# Patient Record
Sex: Female | Born: 1960 | Race: White | Hispanic: No | Marital: Married | Smoking: Never smoker
Health system: Southern US, Community
[De-identification: ages and names within clinical notes are randomized; demographics above are authoritative.]

## PROBLEM LIST (undated history)

## (undated) DIAGNOSIS — K589 Irritable bowel syndrome without diarrhea: Secondary | ICD-10-CM

---

## 2019-04-06 ENCOUNTER — Encounter (HOSPITAL_BASED_OUTPATIENT_CLINIC_OR_DEPARTMENT_OTHER): Payer: Self-pay | Admitting: *Deleted

## 2019-04-06 ENCOUNTER — Emergency Department (HOSPITAL_BASED_OUTPATIENT_CLINIC_OR_DEPARTMENT_OTHER): Payer: BC Managed Care – PPO

## 2019-04-06 ENCOUNTER — Other Ambulatory Visit: Payer: Self-pay

## 2019-04-06 ENCOUNTER — Emergency Department (HOSPITAL_BASED_OUTPATIENT_CLINIC_OR_DEPARTMENT_OTHER)
Admission: EM | Admit: 2019-04-06 | Discharge: 2019-04-06 | Disposition: A | Discharge status: Home / Self Care | Payer: BC Managed Care – PPO | Attending: Emergency Medicine | Admitting: Emergency Medicine

## 2019-04-06 DIAGNOSIS — R519 Headache, unspecified: Secondary | ICD-10-CM | POA: Insufficient documentation

## 2019-04-06 DIAGNOSIS — K5900 Constipation, unspecified: Secondary | ICD-10-CM | POA: Diagnosis not present

## 2019-04-06 HISTORY — DX: Irritable bowel syndrome, unspecified: K58.9

## 2019-04-06 MED ORDER — DIPHENHYDRAMINE HCL 50 MG/ML IJ SOLN
25.0000 mg | Freq: Once | INTRAMUSCULAR | Status: DC
  Filled 2019-04-06: qty 1

## 2019-04-06 MED ORDER — KETOROLAC TROMETHAMINE 15 MG/ML IJ SOLN
15.0000 mg | Freq: Once | INTRAMUSCULAR | Status: AC
  Administered 2019-04-06: 17:00:00 15 mg via INTRAMUSCULAR
  Filled 2019-04-06: qty 1

## 2019-04-06 MED ORDER — PROCHLORPERAZINE EDISYLATE 10 MG/2ML IJ SOLN
10.0000 mg | Freq: Once | INTRAMUSCULAR | Status: DC
  Filled 2019-04-06: qty 2

## 2019-04-06 NOTE — Discharge Instructions (Addendum)
The CT of your head was normal today.  You may alternate ibuprofen or Tylenol to help with your headaches.  Follow-up with your primary care physician as needed.

## 2019-04-06 NOTE — ED Provider Notes (Signed)
[facility]PART[facility] Provider Note   CSN: [id number] Arrival date & time: 04/06/19  1242     History Chief Complaint  Patient presents with  . Headache    Kathy Ray is a 59 y.o. female.  59 y.o female with a PMH of IBS-D presents to the ED with a chief complaint of headache x 2 days. Patient describes this as an intermittent sharp pain to the frontal aspect of her head with radiation to the back of her neck.  She reports this pain is worse with early activities.  She not taking any medication for her symptoms as she reports recent diagnosis of IBS, is concerned that any medication will exacerbate this.  She also endorses constipation, last bowel movement was 3 days ago.  Reports she is currently on Bentyl for her abdominal pain along with Lomotil which she only took once.  There are no deviating factors.  She denies any blurred vision, nausea, vomiting, fevers, neck rigidity.  Last bowel movement 3 days ago was clear without any blood.  The history is provided by the patient.  Headache Associated symptoms: no abdominal pain, no back pain, no fever, no nausea, no neck pain, no photophobia, no sinus pressure and no vomiting        Past Medical History:  Diagnosis Date  . IBS (irritable bowel syndrome)     There are no problems to display for this patient.   History reviewed. No pertinent surgical history.   OB History   No obstetric history on file.     No family history on file.  Social History   Tobacco Use  . Smoking status: Never Smoker  . Smokeless tobacco: Never Used  Substance Use Topics  . Alcohol use: Never  . Drug use: Never    Home Medications Prior to Admission medications   Not on File    Allergies    Nitrofurantoin, Escitalopram oxalate, and Sulfamethoxazole-trimethoprim  Review of Systems   Review of Systems  Constitutional: Negative for fever.  HENT: Negative for sinus pressure and sneezing.   Eyes: Negative for  photophobia, redness and visual disturbance.  Respiratory: Negative for shortness of breath.   Cardiovascular: Negative for chest pain.  Gastrointestinal: Positive for constipation. Negative for abdominal pain, nausea and vomiting.  Musculoskeletal: Negative for back pain and neck pain.  Skin: Negative for pallor and wound.  Neurological: Positive for headaches.    Physical Exam Updated Vital Signs BP (!) 145/87 (BP Location: Right Arm)   Pulse 61   Temp 98.1 F (36.7 C) (Oral)   Resp 16   Ht 5\' 4"  (1.626 m)   Wt 63 kg   SpO2 97%   BMI 23.86 kg/m   Physical Exam Vitals and nursing note reviewed.  Constitutional:      General: She is not in acute distress.    Appearance: She is well-developed.  HENT:     Head: Normocephalic and atraumatic.     Mouth/Throat:     Pharynx: No oropharyngeal exudate.  Eyes:     Pupils: Pupils are equal, round, and reactive to light.  Cardiovascular:     Rate and Rhythm: Regular rhythm.     Heart sounds: Normal heart sounds.  Pulmonary:     Effort: Pulmonary effort is normal. No respiratory distress.     Breath sounds: Normal breath sounds.  Abdominal:     General: Bowel sounds are normal. There is no distension.     Palpations: Abdomen is soft.  Tenderness: There is no abdominal tenderness.  Musculoskeletal:        General: No tenderness or deformity.     Cervical back: Normal range of motion.     Right lower leg: No edema.     Left lower leg: No edema.  Skin:    General: Skin is warm and dry.  Neurological:     Mental Status: She is alert and oriented to person, place, and time.     Comments: Alert, oriented, thought content appropriate. Speech fluent without evidence of aphasia. Able to follow 2 step commands without difficulty.  Cranial Nerves:  II:  Peripheral visual fields grossly normal, pupils, round, reactive to light III,IV, VI: ptosis not present, extra-ocular motions intact bilaterally  V,VII: smile symmetric, facial  light touch sensation equal VIII: hearing grossly normal bilaterally  IX,X: midline uvula rise  XI: bilateral shoulder shrug equal and strong XII: midline tongue extension  Motor:  5/5 in upper and lower extremities bilaterally including strong and equal grip strength and dorsiflexion/plantar flexion Sensory: light touch normal in all extremities.  Cerebellar: normal finger-to-nose with bilateral upper extremities, pronator drift negative Gait: normal gait and balance      ED Results / Procedures / Treatments   Labs (all labs ordered are listed, but only abnormal results are displayed) Labs Reviewed - No data to display  EKG None  Radiology CT Head Wo Contrast  Result Date: 04/06/2019 CLINICAL DATA:  59 year old female with headache. EXAM: CT HEAD WITHOUT CONTRAST TECHNIQUE: Contiguous axial images were obtained from the base of the skull through the vertex without intravenous contrast. COMPARISON:  None. FINDINGS: Brain: The ventricles and sulci appropriate size for patient's age. The gray-white matter discrimination is preserved. There is no acute intracranial hemorrhage. No mass effect or midline shift. No extra-axial fluid collection. Vascular: No hyperdense vessel or unexpected calcification. Skull: Normal. Negative for fracture or focal lesion. Sinuses/Orbits: No acute finding. Other: None IMPRESSION: Unremarkable noncontrast CT of the brain. Electronically Signed   By: Elgie Collard M.D.   On: 04/06/2019 16:52    Procedures Procedures (including critical care time)  Medications Ordered in ED Medications  ketorolac (TORADOL) 15 MG/ML injection 15 mg (15 mg Intramuscular Given 04/06/19 1636)    ED Course  I have reviewed the triage vital signs and the nursing notes.  Pertinent labs & imaging results that were available during my care of the patient were reviewed by me and considered in my medical decision making (see chart for details).    MDM  Rules/Calculators/A&P   Patient with a past medical history of IBS-D sent to the ED with a chief complaint of headache since the weekend, 2 to 3 days.  She reports a frontal sharp pressure with radiation to her neck.  He denies any trauma, fevers, neck rigidity, history of aneurysms.  Patient is neurologically intact.    Patient has not taken any medication for her symptoms as she reports she is afraid that she may upset her IBS-D with the medication.  She arrived in the ED in stable condition, is also endorsing some constipation with the last bowel movement which was yesterday, she is currently on Bentyl along with Lomotil, advised patient this is likely causing her IBS-D to improve with may be some slight constipation.   CT of her head without any intracranial abnormality.  Was attempted to be given a headache cocktail, however patient is currently driving therefore Benadryl was discontinued.  She was given 15 mg of IM Toradol.  Upon my reassessment patient reports improvement in her headache.  She is requesting full active medications for headaches, advised to follow-up with PCP and take either Tylenol or ibuprofen to help with her symptoms.  She does not have any previous history of migraines, is questioning whether this was a migraine.  She is advised to further follow-up with neurology at that time.  No intracranial pathology such as subdural, epidural.  No neck rigidity, no fevers, low suspicion for any meningitis.  She was advised to follow-up with her constipation with PCP who is currently prescribing her the Bentyl along with Lomotil.Return precautions discussed at length.    Portions of this note were generated with Scientist, clinical (histocompatibility and immunogenetics). Dictation errors may occur despite best attempts at proofreading.  Final Clinical Impression(s) / ED Diagnoses Final diagnoses:  Bad headache    Rx / DC Orders ED Discharge Orders    None       Claude Manges, PA-C 04/06/19 1709     Vanetta Mulders, MD 04/17/19 [phone]

## 2019-04-06 NOTE — ED Notes (Signed)
ED Provider at bedside. 

## 2019-04-06 NOTE — ED Triage Notes (Signed)
Headache since yesterday. constipation 3 days ago. States she has a hx of IBS.

## 2021-06-06 IMAGING — CT CT HEAD W/O CM
3 series · 15 of 44 positions shown, 18 images · non-contrast
Comparison: None.

CLINICAL DATA: 58-year-old female with headache.

EXAM:
CT HEAD WITHOUT CONTRAST
TECHNIQUE: Contiguous axial images were obtained from the base of the skull
through the vertex without intravenous contrast.

[Series 2: head wo · axial · 0.40mm/px · z∈[+63,+173]mm · 9 of 27 slices shown, 12 images]
[im 3/27  brain]
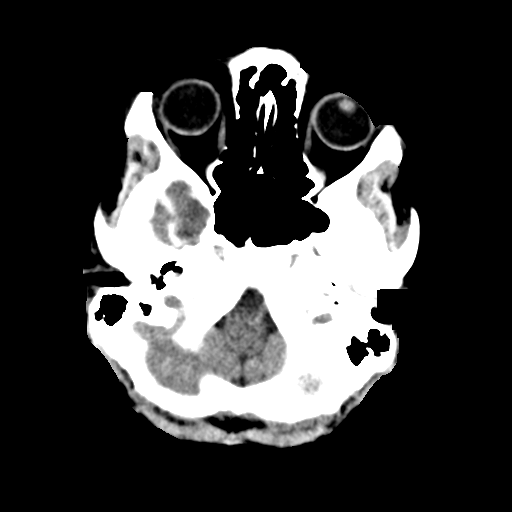
[im 3/27  bone]
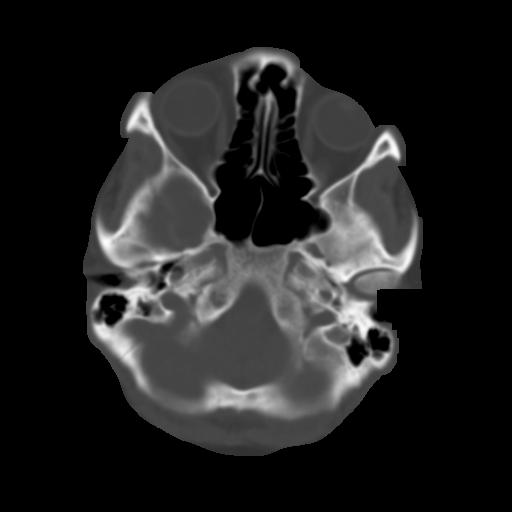
[im 6/27  brain]
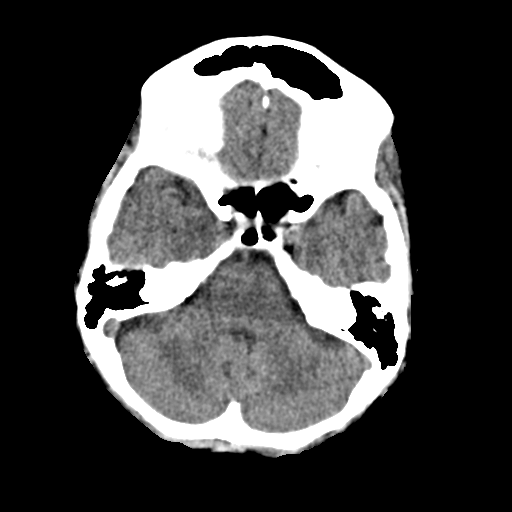
[im 8/27  brain]
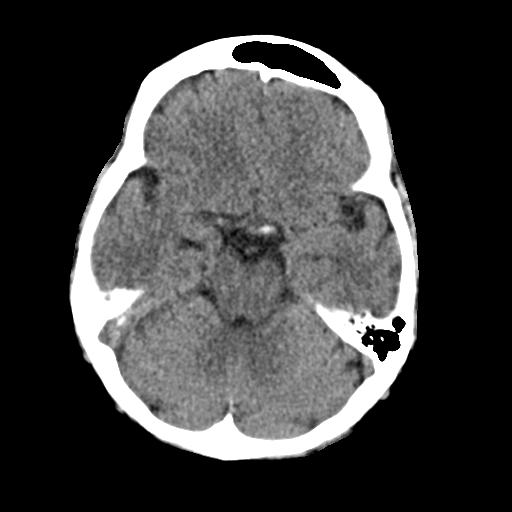
[im 11/27  brain]
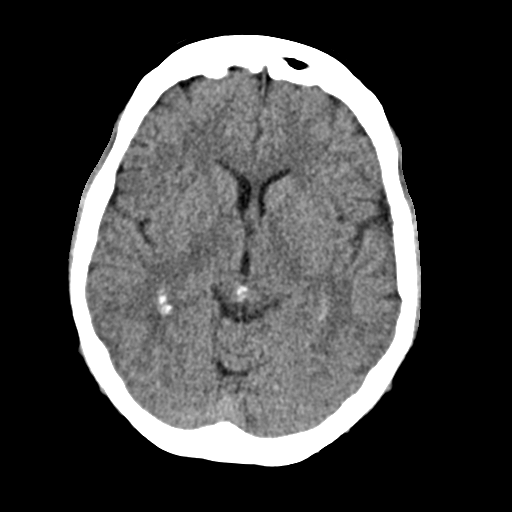
[im 14/27  brain]
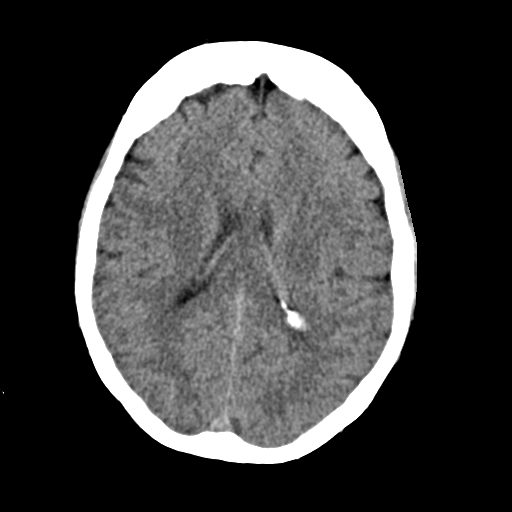
[im 14/27  bone]
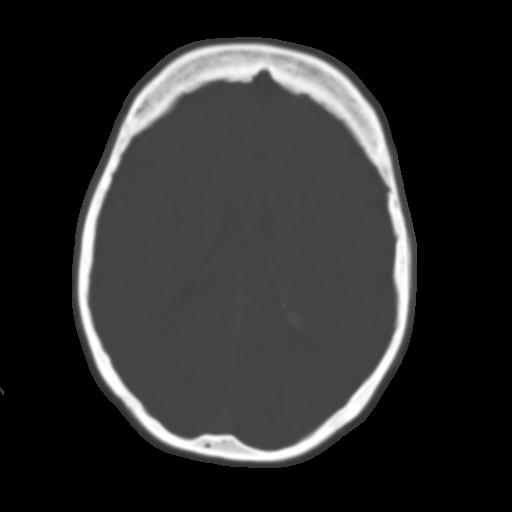
[im 17/27  brain]
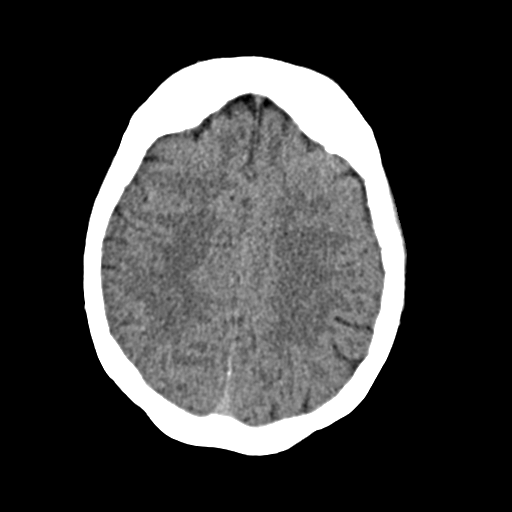
[im 20/27  brain]
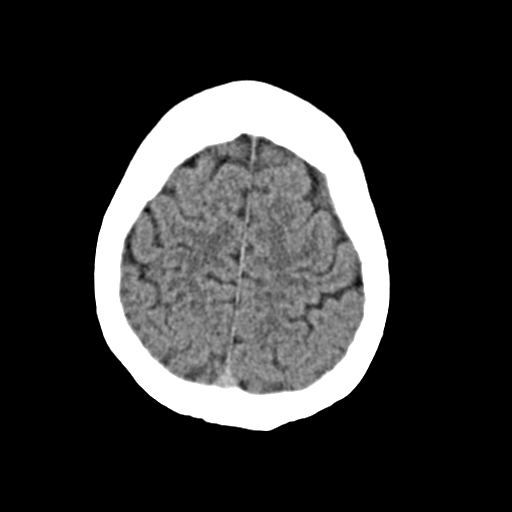
[im 22/27  brain]
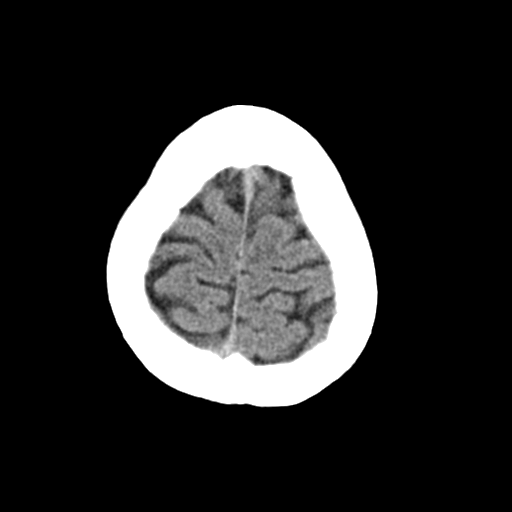
[im 25/27  brain]
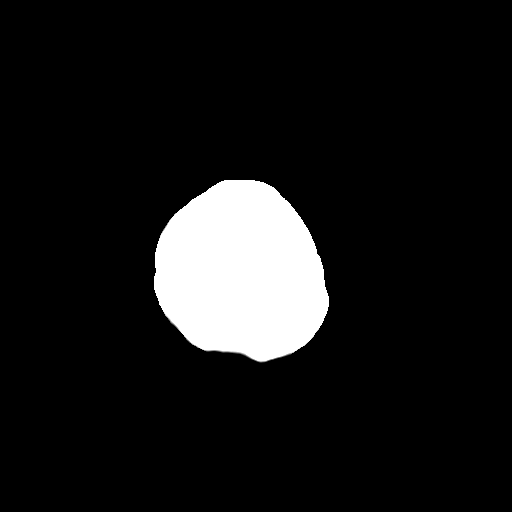
[im 25/27  bone]
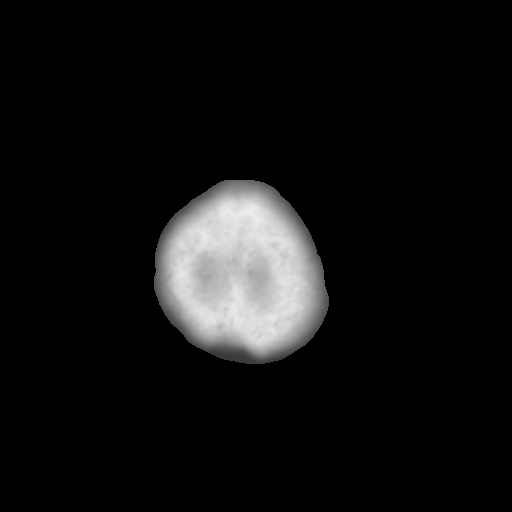

[Series 4: coronal soft · coronal · 0.26mm/px · 3 of 60 slices shown]
[im 20/60  brain]
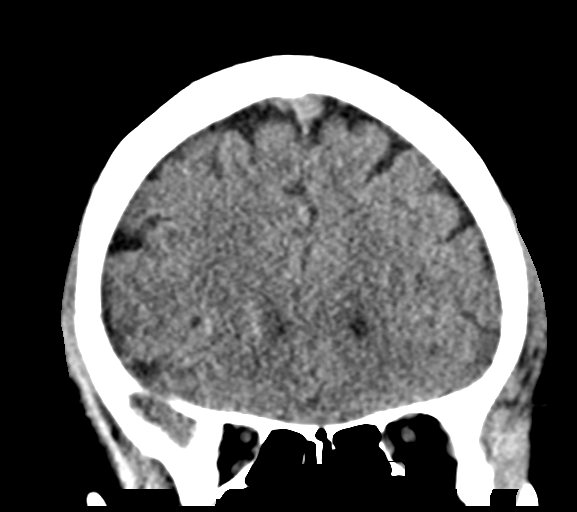
[im 27/60  brain]
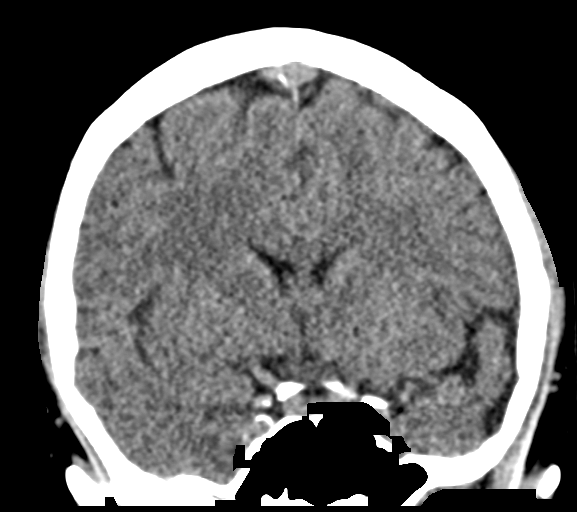
[im 33/60  brain]
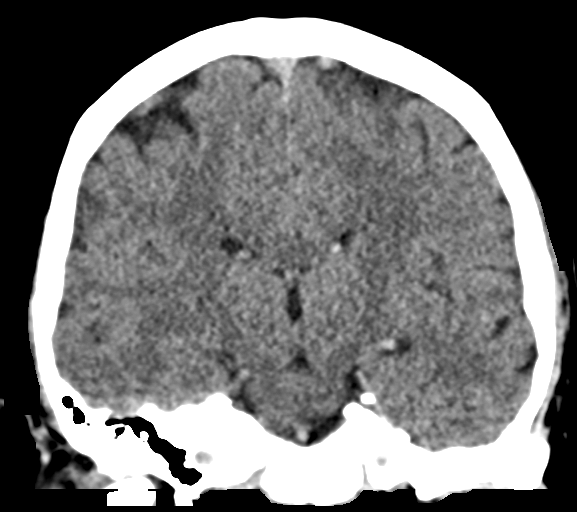

[Series 5: sag soft · sagittal · 0.26mm/px · 3 of 50 slices shown]
[im 17/50  brain]
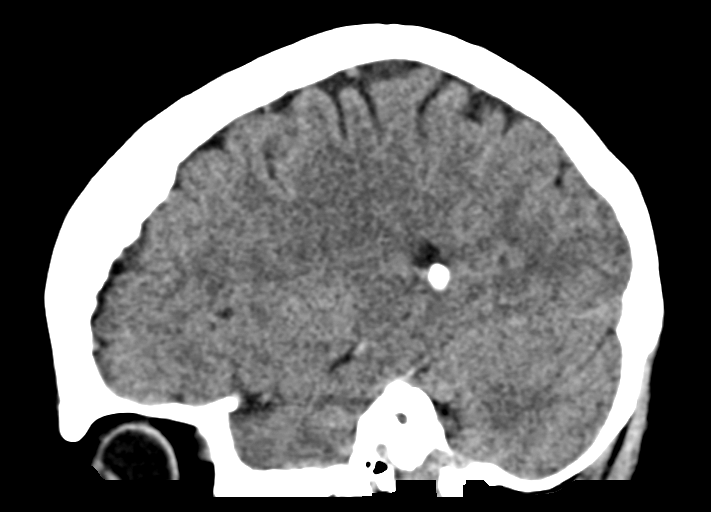
[im 25/50  brain]
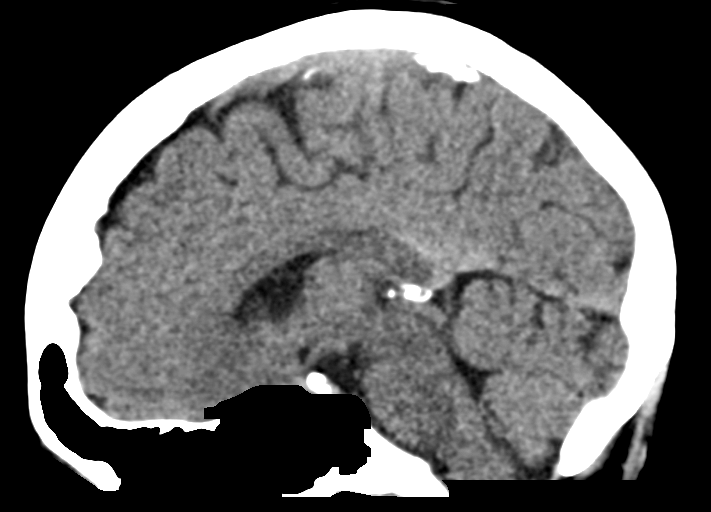
[im 33/50  brain]
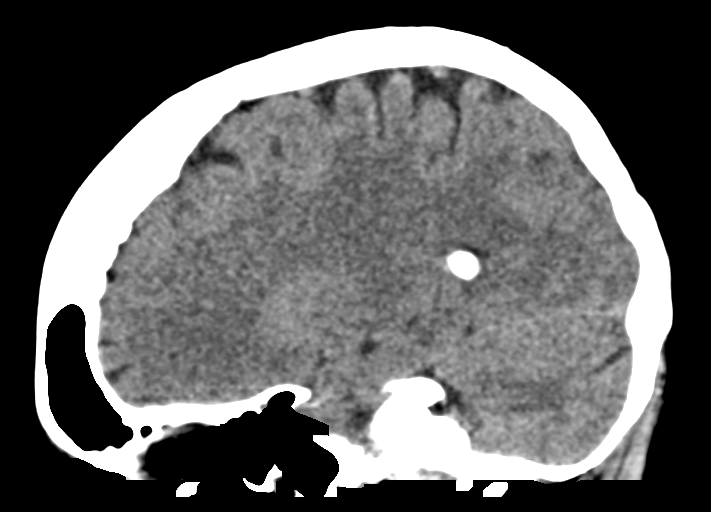

[15 of 44 positions shown; findings below may reference images not displayed]

FINDINGS: Brain: The ventricles and sulci appropriate size for patient's age.
The gray-white matter discrimination is preserved. There is no acute
intracranial hemorrhage. No mass effect or midline shift. No
extra-axial fluid collection.

Vascular: No hyperdense vessel or unexpected calcification.

Skull: Normal. Negative for fracture or focal lesion.

Sinuses/Orbits: No acute finding.

Other: None
IMPRESSION: Unremarkable noncontrast CT of the brain.
